# Patient Record
Sex: Female | Born: 1965 | Race: White | Hispanic: No | Marital: Married | Smoking: Never smoker
Health system: Southern US, Community
[De-identification: ages and names within clinical notes are randomized; demographics above are authoritative.]

## PROBLEM LIST (undated history)

## (undated) HISTORY — PX: ABDOMINAL HYSTERECTOMY: SHX81

---

## 2021-01-31 ENCOUNTER — Emergency Department (HOSPITAL_BASED_OUTPATIENT_CLINIC_OR_DEPARTMENT_OTHER): Payer: BLUE CROSS/BLUE SHIELD | Admitting: Radiology

## 2021-01-31 ENCOUNTER — Other Ambulatory Visit: Payer: Self-pay

## 2021-01-31 ENCOUNTER — Emergency Department (HOSPITAL_BASED_OUTPATIENT_CLINIC_OR_DEPARTMENT_OTHER)
Admission: EM | Admit: 2021-01-31 | Discharge: 2021-02-01 | Disposition: A | Discharge status: Home / Self Care | Payer: BLUE CROSS/BLUE SHIELD | Attending: Emergency Medicine | Admitting: Emergency Medicine

## 2021-01-31 ENCOUNTER — Encounter (HOSPITAL_BASED_OUTPATIENT_CLINIC_OR_DEPARTMENT_OTHER): Payer: Self-pay | Admitting: Obstetrics and Gynecology

## 2021-01-31 DIAGNOSIS — R0789 Other chest pain: Secondary | ICD-10-CM | POA: Diagnosis not present

## 2021-01-31 DIAGNOSIS — Z8619 Personal history of other infectious and parasitic diseases: Secondary | ICD-10-CM

## 2021-01-31 DIAGNOSIS — N644 Mastodynia: Secondary | ICD-10-CM | POA: Insufficient documentation

## 2021-01-31 DIAGNOSIS — R079 Chest pain, unspecified: Secondary | ICD-10-CM

## 2021-01-31 LAB — BASIC METABOLIC PANEL
Anion gap: 10 (ref 5–15)
BUN: 10 mg/dL (ref 6–20)
CO2: 22 mmol/L (ref 22–32)
Calcium: 9.8 mg/dL (ref 8.9–10.3)
Chloride: 106 mmol/L (ref 98–111)
Creatinine, Ser: 0.65 mg/dL (ref 0.44–1.00)
GFR, Estimated: >60 mL/min (ref >60)
Glucose, Bld: 97 mg/dL (ref 70–99)
Potassium: 4.3 mmol/L (ref 3.5–5.1)
Sodium: 138 mmol/L (ref 135–145)

## 2021-01-31 LAB — TROPONIN I (HIGH SENSITIVITY): Troponin I (High Sensitivity): 3 ng/L (ref <18)

## 2021-01-31 LAB — CBC
HCT: 43.9 % (ref 36.0–46.0)
Hemoglobin: 15.1 g/dL — ABNORMAL HIGH (ref 12.0–15.0)
MCH: 30.7 pg (ref 26.0–34.0)
MCHC: 34.4 g/dL (ref 30.0–36.0)
MCV: 89.2 fL (ref 80.0–100.0)
Platelets: 303 10*3/uL (ref 150–400)
RBC: 4.92 MIL/uL (ref 3.87–5.11)
RDW: 13 % (ref 11.5–15.5)
WBC: 6.7 10*3/uL (ref 4.0–10.5)
nRBC: 0 % (ref 0.0–0.2)

## 2021-01-31 NOTE — ED Triage Notes (Signed)
Patient reports to the ER for right sided breast pain. Patient reports it radiates to her back and that she feels extreme fatigue. Patient reports last mammogram x5 years ago.

## 2021-02-01 ENCOUNTER — Encounter (HOSPITAL_BASED_OUTPATIENT_CLINIC_OR_DEPARTMENT_OTHER): Payer: Self-pay | Admitting: Radiology

## 2021-02-01 ENCOUNTER — Emergency Department (HOSPITAL_BASED_OUTPATIENT_CLINIC_OR_DEPARTMENT_OTHER): Payer: BLUE CROSS/BLUE SHIELD

## 2021-02-01 LAB — TROPONIN I (HIGH SENSITIVITY): Troponin I (High Sensitivity): 3 ng/L (ref <18)

## 2021-02-01 MED ORDER — IOHEXOL 350 MG/ML SOLN
100.0000 mL | Freq: Once | INTRAVENOUS | Status: AC | PRN
  Administered 2021-02-01: 75 mL via INTRAVENOUS

## 2021-02-01 MED ORDER — DOXYCYCLINE HYCLATE 100 MG PO CAPS: 100.0000 mg | ORAL_CAPSULE | Freq: Two times a day (BID) | ORAL | 0 refills | Status: AC

## 2021-02-01 MED ORDER — DOXYCYCLINE HYCLATE 100 MG PO TABS
100.0000 mg | ORAL_TABLET | Freq: Once | ORAL | Status: AC
  Administered 2021-02-01: 100 mg via ORAL
  Filled 2021-02-01: qty 1

## 2021-02-01 NOTE — Discharge Instructions (Addendum)
Begin taking doxycycline as prescribed.  Return to the emergency department if you develop worsening pain, difficulty breathing, high fever, or other new and concerning symptoms.

## 2021-02-01 NOTE — ED Provider Notes (Signed)
[facility] EMERGENCY DEPT Provider Note   CSN: [id number] Arrival date & time: 01/31/21  1916     History Chief Complaint  Patient presents with   Breast Pain    Denise Doyle is a 55 y.o. female.  Patient is a 55 year old female with history of prior hysterectomy and tobacco use.  Patient presenting today for evaluation of chest discomfort.  She describes intermittent pains to the right side of the chest for the past year, but this has worsened over the past several days.  The pain is now constant and worse when she breathes or moves.  The pain is located behind her right breast with no associated shortness of breath, productive cough, or fever.  She denies any leg pain or swelling.  The history is provided by the patient.      History reviewed. No pertinent past medical history.  There are no problems to display for this patient.   Past Surgical History:  Procedure Laterality Date   ABDOMINAL HYSTERECTOMY       OB History     Gravida      Para      Term      Preterm      AB      Living  2      SAB      IAB      Ectopic      Multiple      Live Births              No family history on file.  Social History   Tobacco Use   Smoking status: Never    Passive exposure: Never  Vaping Use   Vaping Use: Former  Substance Use Topics   Alcohol use: Not Currently   Drug use: Not Currently    Home Medications Prior to Admission medications   Not on File    Allergies    Patient has no allergy information on record.  Review of Systems   Review of Systems  All other systems reviewed and are negative.  Physical Exam Updated Vital Signs BP 139/64   Pulse 83   Temp 98 F (36.7 C) (Oral)   Resp 18   Ht 5\' 5"  (1.651 m)   Wt 68.9 kg   LMP  (LMP Unknown)   SpO2 95%   BMI 25.29 kg/m   Physical Exam Vitals and nursing note reviewed.  Constitutional:      General: She is not in acute distress.    Appearance: She is  well-developed. She is not diaphoretic.  HENT:     Head: Normocephalic and atraumatic.  Cardiovascular:     Rate and Rhythm: Normal rate and regular rhythm.     Heart sounds: No murmur heard.   No friction rub. No gallop.  Pulmonary:     Effort: Pulmonary effort is normal. No respiratory distress.     Breath sounds: Normal breath sounds. No wheezing.  Abdominal:     General: Bowel sounds are normal. There is no distension.     Palpations: Abdomen is soft.     Tenderness: There is no abdominal tenderness.  Musculoskeletal:        General: Normal range of motion.     Cervical back: Normal range of motion and neck supple.  Skin:    General: Skin is warm and dry.  Neurological:     General: No focal deficit present.     Mental Status: She is alert and oriented to person,  place, and time.    ED Results / Procedures / Treatments   Labs (all labs ordered are listed, but only abnormal results are displayed) Labs Reviewed  CBC - Abnormal; Notable for the following components:      Result Value   Hemoglobin 15.1 (*)    All other components within normal limits  BASIC METABOLIC PANEL  TROPONIN I (HIGH SENSITIVITY)  TROPONIN I (HIGH SENSITIVITY)    EKG EKG Interpretation  Date/Time:  Monday January 31 2021 20:43:05 EDT Ventricular Rate:  78 PR Interval:  150 QRS Duration: 74 QT Interval:  382 QTC Calculation: 435 R Axis:   71 Text Interpretation: Normal sinus rhythm with sinus arrhythmia Normal ECG Confirmed by Geoffery Lyons ([id number]) on 02/01/2021 1:08:51 AM  Radiology DG Chest 2 View  Result Date: 01/31/2021 CLINICAL DATA:  55 year old female with chest pain. EXAM: CHEST - 2 VIEW COMPARISON:  None. FINDINGS: The lungs are clear. There is no pleural effusion or pneumothorax. The cardiac silhouette is within normal limits. No acute osseous pathology. IMPRESSION: No active cardiopulmonary disease. Electronically Signed   By: Elgie Collard M.D.   On: 01/31/2021 21:31     Procedures Procedures   Medications Ordered in ED Medications - No data to display  ED Course  I have reviewed the triage vital signs and the nursing notes.  Pertinent labs & imaging results that were available during my care of the patient were reviewed by me and considered in my medical decision making (see chart for details).    MDM Rules/Calculators/A&P  Patient presenting here with complaints of right-sided chest and breast pain.  She tells me this has worsened over the past year, but worsened significantly over the past few days.  Her work-up is unremarkable including CTA of the chest.  She is a smoker, but there is no evidence for malignancy.  Study negative for PE.  Patient tells me she has a history of Lyme's disease which flares up intermittently and requires doxycycline.  She is concerned this may be the case today and has requested a prescription for this.  She will be discharged with doxycycline as she tells me this has helped in the past.  Final Clinical Impression(s) / ED Diagnoses Final diagnoses:  None    Rx / DC Orders ED Discharge Orders     None        Geoffery Lyons, MD 02/01/21 0221

## 2022-02-19 IMAGING — CT CT ANGIO CHEST
2 of 7 series · 13 of 36 positions shown · IV contrast (omnipaque)
Comparison: Chest radiograph dated 01/31/2021.

CLINICAL DATA: 55-year-old female with concern for pulmonary
embolism.

EXAM:
CT ANGIOGRAPHY CHEST WITH CONTRAST
TECHNIQUE: Multidetector CT imaging of the chest was performed using the
standard protocol during bolus administration of intravenous
contrast. Multiplanar CT image reconstructions and MIPs were
obtained to evaluate the vascular anatomy.
CONTRAST:  75mL OMNIPAQUE IOHEXOL 350 MG/ML SOLN

[Series 5: pe axial thins · axial · 0.72mm/px · z∈[-646,-382]mm · 12 of 312 slices shown]
[im 24/312  lung]
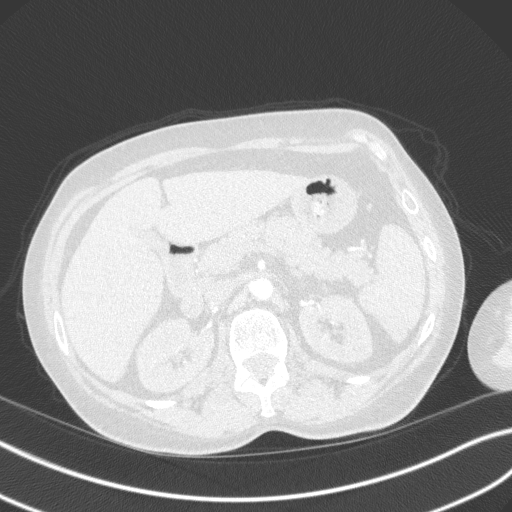
[im 48/312  mediastinal]
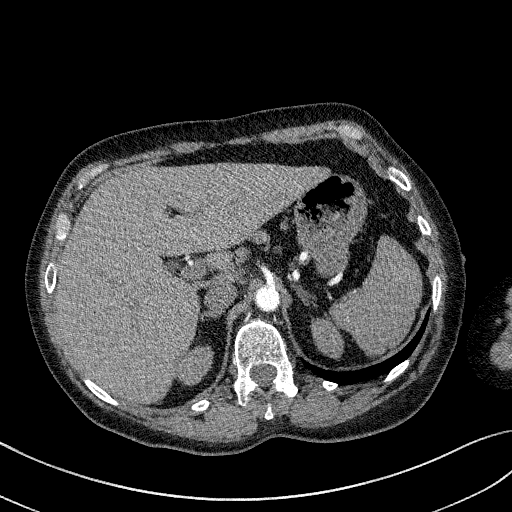
[im 72/312  lung]
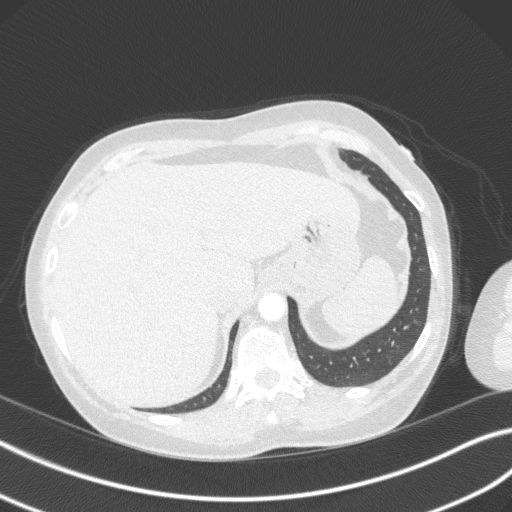
[im 96/312  mediastinal]
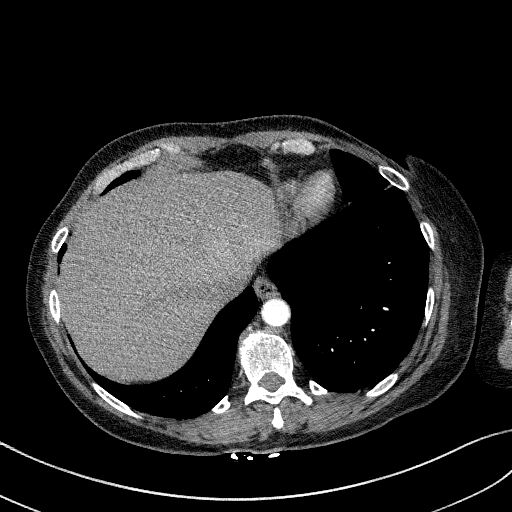
[im 120/312  lung]
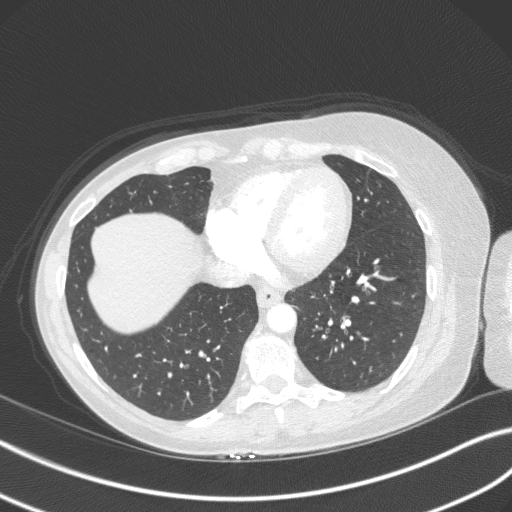
[im 144/312  mediastinal]
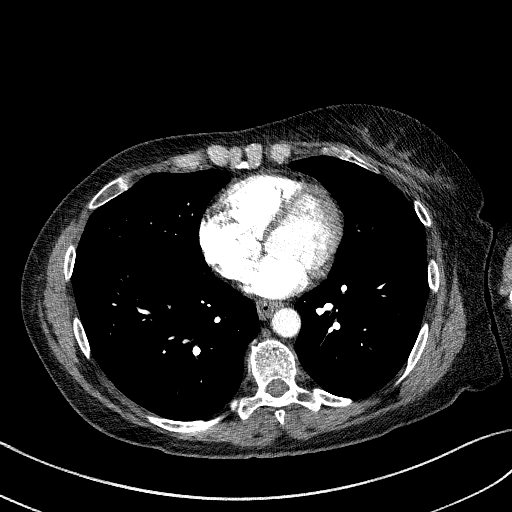
[im 168/312  lung]
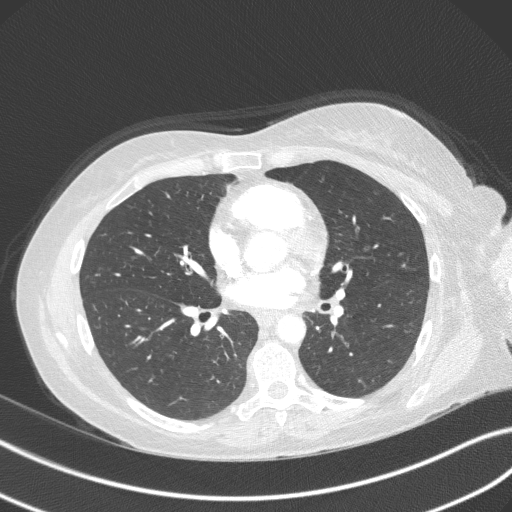
[im 192/312  mediastinal]
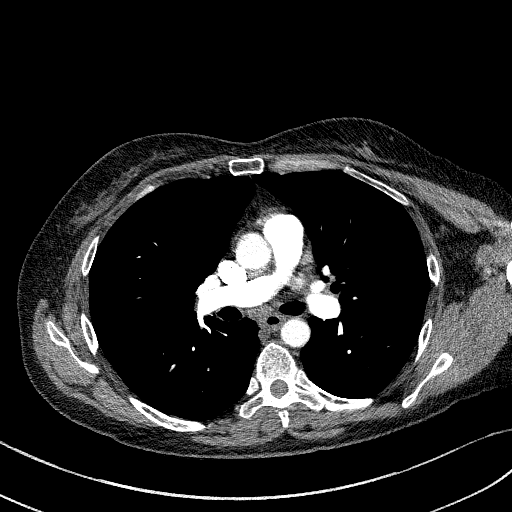
[im 216/312  lung]
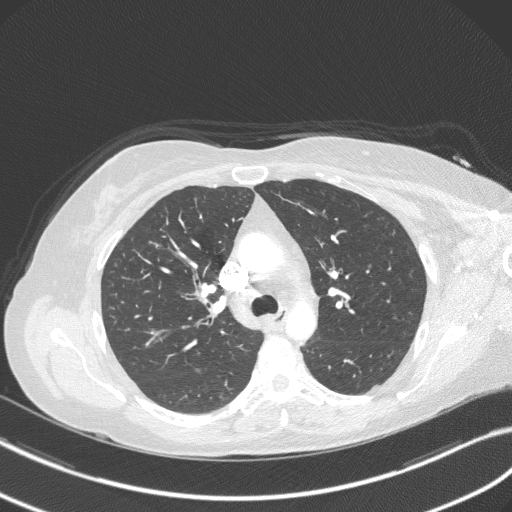
[im 240/312  mediastinal]
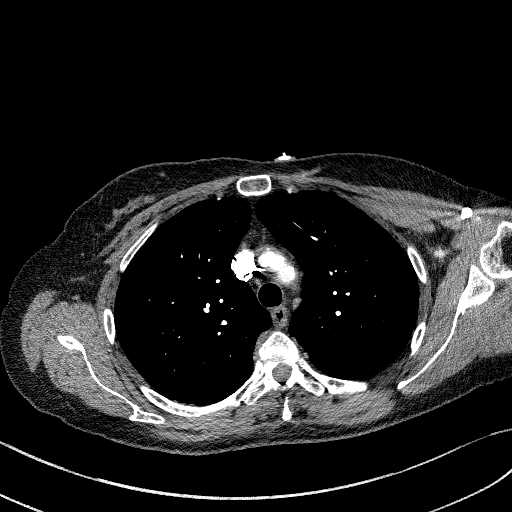
[im 264/312  lung]
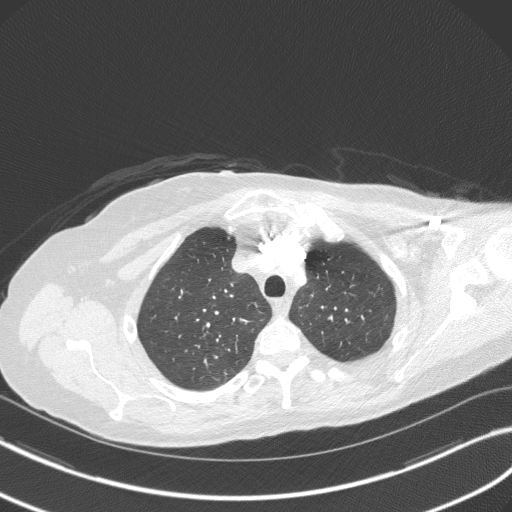
[im 288/312  mediastinal]
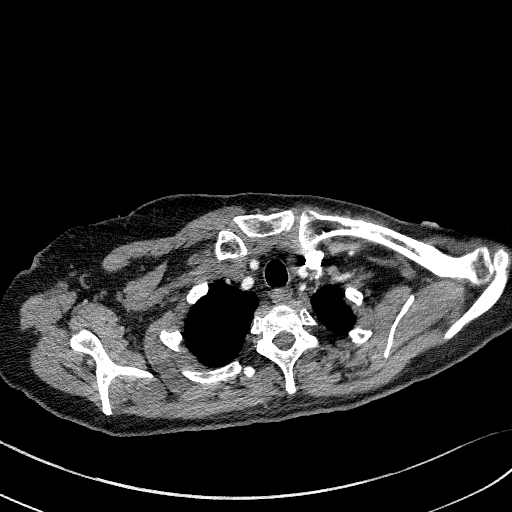

[Series 7: cor soft · coronal · 0.64mm/px · 1 of 117 slices shown]
[im 59/117  mediastinal]
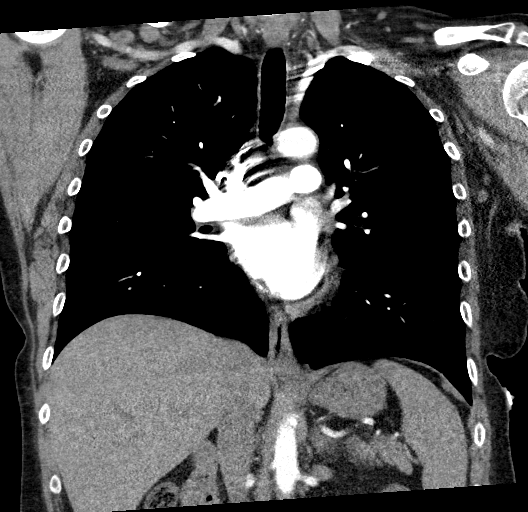

[13 of 36 positions shown; findings below may reference images not displayed]

FINDINGS: Cardiovascular: There is no cardiomegaly or pericardial effusion.
The thoracic aorta is unremarkable. The origins of the great vessels
of the aortic arch appear patent. No CT evidence of pulmonary
embolism.

Mediastinum/Nodes: No hilar or mediastinal adenopathy. The esophagus
and the thyroid gland are grossly unremarkable. No mediastinal fluid
collection.

Lungs/Pleura: The lungs are clear. There is no pleural effusion or
pneumothorax. The central airways are patent.

Upper Abdomen: No acute abnormality.

Musculoskeletal: No acute osseous pathology.

Review of the MIP images confirms the above findings.
IMPRESSION: No acute intrathoracic pathology. No CT evidence of pulmonary
embolism.
